# Patient Record
Sex: Male | Born: 1940
Health system: Southern US, Community
[De-identification: ages and names within clinical notes are randomized; demographics above are authoritative.]

---

## 2005-12-05 ENCOUNTER — Ambulatory Visit (HOSPITAL_COMMUNITY): Admission: RE | Admit: 2005-12-05 | Discharge: 2005-12-05 | Payer: Self-pay | Admitting: Orthopedic Surgery

## 2006-07-10 ENCOUNTER — Encounter: Admission: RE | Admit: 2006-07-10 | Discharge: 2006-07-10 | Payer: Self-pay | Admitting: Internal Medicine

## 2011-12-14 DIAGNOSIS — Z1331 Encounter for screening for depression: Secondary | ICD-10-CM | POA: Diagnosis not present

## 2011-12-14 DIAGNOSIS — Z0000 Encounter for general adult medical examination without abnormal findings: Secondary | ICD-10-CM | POA: Diagnosis not present

## 2011-12-14 DIAGNOSIS — E7800 Pure hypercholesterolemia, unspecified: Secondary | ICD-10-CM | POA: Diagnosis not present

## 2011-12-14 DIAGNOSIS — I10 Essential (primary) hypertension: Secondary | ICD-10-CM | POA: Diagnosis not present

## 2012-02-20 DIAGNOSIS — L419 Parapsoriasis, unspecified: Secondary | ICD-10-CM | POA: Diagnosis not present

## 2012-02-20 DIAGNOSIS — Q828 Other specified congenital malformations of skin: Secondary | ICD-10-CM | POA: Diagnosis not present

## 2012-03-25 DIAGNOSIS — H524 Presbyopia: Secondary | ICD-10-CM | POA: Diagnosis not present

## 2012-03-25 DIAGNOSIS — H35369 Drusen (degenerative) of macula, unspecified eye: Secondary | ICD-10-CM | POA: Diagnosis not present

## 2012-05-18 DIAGNOSIS — Z23 Encounter for immunization: Secondary | ICD-10-CM | POA: Diagnosis not present

## 2012-12-19 DIAGNOSIS — I10 Essential (primary) hypertension: Secondary | ICD-10-CM | POA: Diagnosis not present

## 2012-12-19 DIAGNOSIS — Z1331 Encounter for screening for depression: Secondary | ICD-10-CM | POA: Diagnosis not present

## 2012-12-19 DIAGNOSIS — Z0000 Encounter for general adult medical examination without abnormal findings: Secondary | ICD-10-CM | POA: Diagnosis not present

## 2012-12-19 DIAGNOSIS — E7800 Pure hypercholesterolemia, unspecified: Secondary | ICD-10-CM | POA: Diagnosis not present

## 2013-01-24 DIAGNOSIS — D1801 Hemangioma of skin and subcutaneous tissue: Secondary | ICD-10-CM | POA: Diagnosis not present

## 2013-01-24 DIAGNOSIS — L570 Actinic keratosis: Secondary | ICD-10-CM | POA: Diagnosis not present

## 2013-01-24 DIAGNOSIS — L821 Other seborrheic keratosis: Secondary | ICD-10-CM | POA: Diagnosis not present

## 2013-04-09 DIAGNOSIS — Z23 Encounter for immunization: Secondary | ICD-10-CM | POA: Diagnosis not present

## 2014-01-06 DIAGNOSIS — E7800 Pure hypercholesterolemia, unspecified: Secondary | ICD-10-CM | POA: Diagnosis not present

## 2014-01-06 DIAGNOSIS — Z0000 Encounter for general adult medical examination without abnormal findings: Secondary | ICD-10-CM | POA: Diagnosis not present

## 2014-01-06 DIAGNOSIS — Z23 Encounter for immunization: Secondary | ICD-10-CM | POA: Diagnosis not present

## 2014-01-06 DIAGNOSIS — I700 Atherosclerosis of aorta: Secondary | ICD-10-CM | POA: Diagnosis not present

## 2014-01-06 DIAGNOSIS — I10 Essential (primary) hypertension: Secondary | ICD-10-CM | POA: Diagnosis not present

## 2014-03-17 DIAGNOSIS — K648 Other hemorrhoids: Secondary | ICD-10-CM | POA: Diagnosis not present

## 2014-03-17 DIAGNOSIS — Z800 Family history of malignant neoplasm of digestive organs: Secondary | ICD-10-CM | POA: Diagnosis not present

## 2014-03-17 DIAGNOSIS — Z1211 Encounter for screening for malignant neoplasm of colon: Secondary | ICD-10-CM | POA: Diagnosis not present

## 2014-05-27 DIAGNOSIS — Z23 Encounter for immunization: Secondary | ICD-10-CM | POA: Diagnosis not present

## 2014-06-15 DIAGNOSIS — Z23 Encounter for immunization: Secondary | ICD-10-CM | POA: Diagnosis not present

## 2014-06-23 DIAGNOSIS — H2513 Age-related nuclear cataract, bilateral: Secondary | ICD-10-CM | POA: Diagnosis not present

## 2014-12-24 DIAGNOSIS — L570 Actinic keratosis: Secondary | ICD-10-CM | POA: Diagnosis not present

## 2014-12-24 DIAGNOSIS — L821 Other seborrheic keratosis: Secondary | ICD-10-CM | POA: Diagnosis not present

## 2014-12-24 DIAGNOSIS — D1721 Benign lipomatous neoplasm of skin and subcutaneous tissue of right arm: Secondary | ICD-10-CM | POA: Diagnosis not present

## 2014-12-24 DIAGNOSIS — D1801 Hemangioma of skin and subcutaneous tissue: Secondary | ICD-10-CM | POA: Diagnosis not present

## 2014-12-24 DIAGNOSIS — L820 Inflamed seborrheic keratosis: Secondary | ICD-10-CM | POA: Diagnosis not present

## 2015-01-26 DIAGNOSIS — Z125 Encounter for screening for malignant neoplasm of prostate: Secondary | ICD-10-CM | POA: Diagnosis not present

## 2015-01-26 DIAGNOSIS — E780 Pure hypercholesterolemia: Secondary | ICD-10-CM | POA: Diagnosis not present

## 2015-01-26 DIAGNOSIS — Z23 Encounter for immunization: Secondary | ICD-10-CM | POA: Diagnosis not present

## 2015-01-26 DIAGNOSIS — I10 Essential (primary) hypertension: Secondary | ICD-10-CM | POA: Diagnosis not present

## 2015-01-26 DIAGNOSIS — Z1389 Encounter for screening for other disorder: Secondary | ICD-10-CM | POA: Diagnosis not present

## 2015-01-26 DIAGNOSIS — Z0000 Encounter for general adult medical examination without abnormal findings: Secondary | ICD-10-CM | POA: Diagnosis not present

## 2015-05-14 DIAGNOSIS — Z23 Encounter for immunization: Secondary | ICD-10-CM | POA: Diagnosis not present

## 2015-06-29 DIAGNOSIS — L578 Other skin changes due to chronic exposure to nonionizing radiation: Secondary | ICD-10-CM | POA: Diagnosis not present

## 2015-06-29 DIAGNOSIS — D1801 Hemangioma of skin and subcutaneous tissue: Secondary | ICD-10-CM | POA: Diagnosis not present

## 2015-06-29 DIAGNOSIS — L570 Actinic keratosis: Secondary | ICD-10-CM | POA: Diagnosis not present

## 2015-06-29 DIAGNOSIS — D1721 Benign lipomatous neoplasm of skin and subcutaneous tissue of right arm: Secondary | ICD-10-CM | POA: Diagnosis not present

## 2015-06-29 DIAGNOSIS — L821 Other seborrheic keratosis: Secondary | ICD-10-CM | POA: Diagnosis not present

## 2015-08-24 DIAGNOSIS — Z23 Encounter for immunization: Secondary | ICD-10-CM | POA: Diagnosis not present

## 2016-01-27 DIAGNOSIS — L853 Xerosis cutis: Secondary | ICD-10-CM | POA: Diagnosis not present

## 2016-01-27 DIAGNOSIS — Z125 Encounter for screening for malignant neoplasm of prostate: Secondary | ICD-10-CM | POA: Diagnosis not present

## 2016-01-27 DIAGNOSIS — Z0000 Encounter for general adult medical examination without abnormal findings: Secondary | ICD-10-CM | POA: Diagnosis not present

## 2016-01-27 DIAGNOSIS — Z1389 Encounter for screening for other disorder: Secondary | ICD-10-CM | POA: Diagnosis not present

## 2016-01-27 DIAGNOSIS — I700 Atherosclerosis of aorta: Secondary | ICD-10-CM | POA: Diagnosis not present

## 2016-01-27 DIAGNOSIS — Z79899 Other long term (current) drug therapy: Secondary | ICD-10-CM | POA: Diagnosis not present

## 2016-01-27 DIAGNOSIS — I10 Essential (primary) hypertension: Secondary | ICD-10-CM | POA: Diagnosis not present

## 2016-01-27 DIAGNOSIS — E7800 Pure hypercholesterolemia, unspecified: Secondary | ICD-10-CM | POA: Diagnosis not present

## 2016-05-09 DIAGNOSIS — Z23 Encounter for immunization: Secondary | ICD-10-CM | POA: Diagnosis not present

## 2016-08-04 DIAGNOSIS — D2261 Melanocytic nevi of right upper limb, including shoulder: Secondary | ICD-10-CM | POA: Diagnosis not present

## 2016-08-04 DIAGNOSIS — D1721 Benign lipomatous neoplasm of skin and subcutaneous tissue of right arm: Secondary | ICD-10-CM | POA: Diagnosis not present

## 2016-08-04 DIAGNOSIS — D1801 Hemangioma of skin and subcutaneous tissue: Secondary | ICD-10-CM | POA: Diagnosis not present

## 2016-08-04 DIAGNOSIS — D2262 Melanocytic nevi of left upper limb, including shoulder: Secondary | ICD-10-CM | POA: Diagnosis not present

## 2016-08-04 DIAGNOSIS — L111 Transient acantholytic dermatosis [Grover]: Secondary | ICD-10-CM | POA: Diagnosis not present

## 2016-08-04 DIAGNOSIS — I788 Other diseases of capillaries: Secondary | ICD-10-CM | POA: Diagnosis not present

## 2016-08-04 DIAGNOSIS — L821 Other seborrheic keratosis: Secondary | ICD-10-CM | POA: Diagnosis not present

## 2016-08-04 DIAGNOSIS — D485 Neoplasm of uncertain behavior of skin: Secondary | ICD-10-CM | POA: Diagnosis not present

## 2016-08-04 DIAGNOSIS — L820 Inflamed seborrheic keratosis: Secondary | ICD-10-CM | POA: Diagnosis not present

## 2017-01-03 DIAGNOSIS — H52203 Unspecified astigmatism, bilateral: Secondary | ICD-10-CM | POA: Diagnosis not present

## 2017-01-03 DIAGNOSIS — H2513 Age-related nuclear cataract, bilateral: Secondary | ICD-10-CM | POA: Diagnosis not present

## 2017-03-08 DIAGNOSIS — H25812 Combined forms of age-related cataract, left eye: Secondary | ICD-10-CM | POA: Diagnosis not present

## 2017-03-08 DIAGNOSIS — H2512 Age-related nuclear cataract, left eye: Secondary | ICD-10-CM | POA: Diagnosis not present

## 2017-03-19 DIAGNOSIS — Z79899 Other long term (current) drug therapy: Secondary | ICD-10-CM | POA: Diagnosis not present

## 2017-03-19 DIAGNOSIS — E7800 Pure hypercholesterolemia, unspecified: Secondary | ICD-10-CM | POA: Diagnosis not present

## 2017-03-19 DIAGNOSIS — Z0000 Encounter for general adult medical examination without abnormal findings: Secondary | ICD-10-CM | POA: Diagnosis not present

## 2017-03-19 DIAGNOSIS — Z1389 Encounter for screening for other disorder: Secondary | ICD-10-CM | POA: Diagnosis not present

## 2017-03-19 DIAGNOSIS — I700 Atherosclerosis of aorta: Secondary | ICD-10-CM | POA: Diagnosis not present

## 2017-03-19 DIAGNOSIS — N401 Enlarged prostate with lower urinary tract symptoms: Secondary | ICD-10-CM | POA: Diagnosis not present

## 2017-03-19 DIAGNOSIS — I10 Essential (primary) hypertension: Secondary | ICD-10-CM | POA: Diagnosis not present

## 2017-03-29 DIAGNOSIS — H2511 Age-related nuclear cataract, right eye: Secondary | ICD-10-CM | POA: Diagnosis not present

## 2017-03-29 DIAGNOSIS — H25811 Combined forms of age-related cataract, right eye: Secondary | ICD-10-CM | POA: Diagnosis not present

## 2017-08-10 DIAGNOSIS — L821 Other seborrheic keratosis: Secondary | ICD-10-CM | POA: Diagnosis not present

## 2017-08-10 DIAGNOSIS — L570 Actinic keratosis: Secondary | ICD-10-CM | POA: Diagnosis not present

## 2017-08-10 DIAGNOSIS — D1801 Hemangioma of skin and subcutaneous tissue: Secondary | ICD-10-CM | POA: Diagnosis not present

## 2017-10-19 ENCOUNTER — Ambulatory Visit (INDEPENDENT_AMBULATORY_CARE_PROVIDER_SITE_OTHER): Payer: Medicare Other

## 2017-10-19 ENCOUNTER — Ambulatory Visit (INDEPENDENT_AMBULATORY_CARE_PROVIDER_SITE_OTHER): Payer: Medicare Other | Admitting: Orthopedic Surgery

## 2017-10-19 DIAGNOSIS — M25561 Pain in right knee: Secondary | ICD-10-CM

## 2017-10-19 DIAGNOSIS — M25461 Effusion, right knee: Secondary | ICD-10-CM

## 2017-10-20 ENCOUNTER — Encounter (INDEPENDENT_AMBULATORY_CARE_PROVIDER_SITE_OTHER): Payer: Self-pay | Admitting: Orthopedic Surgery

## 2017-10-20 NOTE — Progress Notes (Signed)
   Office Visit Note   Patient: Richard Macias           Date of Birth: Dec 27, 1940           MRN: [id number] Visit Date: 10/19/2017 Requested by: No referring provider defined for this encounter. PCP: Richard Manes, MD  Subjective: Chief Complaint  Patient presents with  . Right Knee - Pain    HPI: Richard Macias is an active 77 year old patient with right knee pain.  Been going on for about 4 weeks.  Denies any history of injury but he has been doing some lunging type exercises in his balance class.  He thinks it may be from playing tennis as well.  Over 10 years ago he had arthroscopy in both knees for medial meniscal tears.  He can ride a stationary bike without problems.  He states that his symptoms are gradually getting better.  Localizes the pain in the right knee to the anterior lateral joint line.  Denies any mechanical symptoms.  No issues with the left knee.  He will occasionally take Relafen for the problem which does help.  Has no back issues.              ROS: All systems reviewed are negative as they relate to the chief complaint within the history of present illness.  Patient denies  fevers or chills.   Assessment & Plan: Visit Diagnoses:  1. Acute pain of right knee   2. Effusion, right knee     Plan: Impression is mild right knee effusion with no mechanical symptoms and pretty good looking radiographs.  Problem is likely related to the way he is exercising particularly in that balance class.  Favor activity modification and observation, an injection may be required for worsening symptoms or effusion .  Follow-up with me as needed.  Follow-Up Instructions: Return if symptoms worsen or fail to improve.   Orders:  Orders Placed This Encounter  Procedures  . XR Knee 1-2 Views Right   No orders of the defined types were placed in this encounter.     Procedures: No procedures performed   Clinical Data: No additional findings.  Objective: Vital Signs: There were no  vitals taken for this visit.  Physical Exam:   Constitutional: Patient appears well-developed HEENT:  Head: Normocephalic Eyes:EOM are normal Neck: Normal range of motion Cardiovascular: Normal rate Pulmonary/chest: Effort normal Neurologic: Patient is alert Skin: Skin is warm Psychiatric: Patient has normal mood and affect    Ortho Exam: Orthopedic exam demonstrates trace effusion in the right knee no effusion in the left knee.  Range of motion is full.  No flexion contracture present.  Pedal pulses palpable.  No groin pain with internal/external rotation of the leg.  No other masses lymphadenopathy or skin changes noted in that knee region.  Minimal patellofemoral crepitus is present bilaterally.  Specialty Comments:  No specialty comments available.  Imaging: No results found.   PMFS History: There are no active problems to display for this patient.  History reviewed. No pertinent past medical history.  History reviewed. No pertinent family history.  History reviewed. No pertinent surgical history. Social History   Occupational History  . Not on file  Tobacco Use  . Smoking status: Not on file  Substance and Sexual Activity  . Alcohol use: Not on file  . Drug use: Not on file  . Sexual activity: Not on file

## 2017-12-03 ENCOUNTER — Telehealth (INDEPENDENT_AMBULATORY_CARE_PROVIDER_SITE_OTHER): Payer: Self-pay | Admitting: Orthopedic Surgery

## 2017-12-03 DIAGNOSIS — M25561 Pain in right knee: Secondary | ICD-10-CM

## 2017-12-03 NOTE — Telephone Encounter (Signed)
Please advise. Ok to order?

## 2017-12-03 NOTE — Telephone Encounter (Signed)
I tried calling. No answer. LMVM advising order submitted.

## 2017-12-03 NOTE — Telephone Encounter (Signed)
yes

## 2017-12-03 NOTE — Telephone Encounter (Signed)
Patient is asking for you to put an order in for an MRI for his right knee. He is saying he still cannot fully extend it. He would like a call once order is put in  [phone]

## 2017-12-07 ENCOUNTER — Ambulatory Visit
Admission: RE | Admit: 2017-12-07 | Discharge: 2017-12-07 | Disposition: A | Discharge status: Home / Self Care | Payer: Medicare Other | Source: Ambulatory Visit | Attending: Orthopedic Surgery | Admitting: Orthopedic Surgery

## 2017-12-07 DIAGNOSIS — M25561 Pain in right knee: Secondary | ICD-10-CM

## 2017-12-07 DIAGNOSIS — M1711 Unilateral primary osteoarthritis, right knee: Secondary | ICD-10-CM | POA: Diagnosis not present

## 2017-12-21 ENCOUNTER — Encounter (INDEPENDENT_AMBULATORY_CARE_PROVIDER_SITE_OTHER): Payer: Self-pay | Admitting: Orthopedic Surgery

## 2017-12-21 ENCOUNTER — Encounter: Payer: Self-pay | Admitting: Orthopedic Surgery

## 2017-12-21 DIAGNOSIS — M94261 Chondromalacia, right knee: Secondary | ICD-10-CM | POA: Diagnosis not present

## 2017-12-21 DIAGNOSIS — M23241 Derangement of anterior horn of lateral meniscus due to old tear or injury, right knee: Secondary | ICD-10-CM | POA: Diagnosis not present

## 2017-12-21 DIAGNOSIS — M659 Synovitis and tenosynovitis, unspecified: Secondary | ICD-10-CM | POA: Diagnosis not present

## 2017-12-21 DIAGNOSIS — G8918 Other acute postprocedural pain: Secondary | ICD-10-CM | POA: Diagnosis not present

## 2017-12-24 ENCOUNTER — Ambulatory Visit (INDEPENDENT_AMBULATORY_CARE_PROVIDER_SITE_OTHER): Payer: Medicare Other | Admitting: Orthopedic Surgery

## 2017-12-24 ENCOUNTER — Encounter (INDEPENDENT_AMBULATORY_CARE_PROVIDER_SITE_OTHER): Payer: Self-pay | Admitting: Orthopedic Surgery

## 2017-12-24 DIAGNOSIS — M25461 Effusion, right knee: Secondary | ICD-10-CM

## 2017-12-28 ENCOUNTER — Ambulatory Visit (INDEPENDENT_AMBULATORY_CARE_PROVIDER_SITE_OTHER): Payer: Medicare Other | Admitting: Orthopedic Surgery

## 2017-12-28 DIAGNOSIS — S838X1D Sprain of other specified parts of right knee, subsequent encounter: Secondary | ICD-10-CM

## 2017-12-29 ENCOUNTER — Encounter (INDEPENDENT_AMBULATORY_CARE_PROVIDER_SITE_OTHER): Payer: Self-pay | Admitting: Orthopedic Surgery

## 2017-12-29 NOTE — Progress Notes (Signed)
   Post-Op Visit Note   Patient: Richard Macias           Date of Birth: [birth year]           MRN: [id number] Visit Date: 12/24/2017 PCP: Lajean Manes, MD   Assessment & Plan:  Chief Complaint:  Chief Complaint  Patient presents with  . Right Knee - Pain   Visit Diagnoses:  1. Effusion, right knee     Plan: Richard Macias is now about 4 days out right knee arthroscopy.  He had some swelling in his knee.  Aspiration is performed today and we got about 60 cc out.  Plan to continue appointment for Friday.  Toradol injected into the knee today  Follow-Up Instructions: No follow-ups on file.   Orders:  No orders of the defined types were placed in this encounter.  No orders of the defined types were placed in this encounter.   Imaging: No results found.  PMFS History: There are no active problems to display for this patient.  History reviewed. No pertinent past medical history.  History reviewed. No pertinent family history.  History reviewed. No pertinent surgical history. Social History   Occupational History  . Not on file  Tobacco Use  . Smoking status: Never Smoker  . Smokeless tobacco: Never Used  Substance and Sexual Activity  . Alcohol use: Not on file  . Drug use: Not on file  . Sexual activity: Not on file

## 2017-12-29 NOTE — Progress Notes (Signed)
   Post-Op Visit Note   Patient: Richard Macias           Date of Birth: 09-28-1940           MRN: [id number] Visit Date: 12/28/2017 PCP: Lajean Manes, MD   Assessment & Plan:  Chief Complaint:  Chief Complaint  Patient presents with  . Right Knee - Routine Post Op    12/21/17 right knee arthroscopy   Visit Diagnoses:  1. Injury of meniscus of right knee, subsequent encounter     Plan: Lanny is a patient who is now 7 days out right knee arthroscopy and partial lateral meniscectomy.  Saw him earlier this week and aspirated a fair amount of blood out of the knee.  He is been doing better.  Not taking any medications.  He is walking well.  On exam there is only mild effusion and excellent range of motion.  Portal sutures removed.  Plan is to start a non-loadbearing exercise program focusing on water walking not sooner than 2 weeks as well as stationary bike.  I will see him back as needed  Follow-Up Instructions: Return if symptoms worsen or fail to improve.   Orders:  No orders of the defined types were placed in this encounter.  No orders of the defined types were placed in this encounter.   Imaging: No results found.  PMFS History: There are no active problems to display for this patient.  History reviewed. No pertinent past medical history.  History reviewed. No pertinent family history.  History reviewed. No pertinent surgical history. Social History   Occupational History  . Not on file  Tobacco Use  . Smoking status: Never Smoker  . Smokeless tobacco: Never Used  Substance and Sexual Activity  . Alcohol use: Not on file  . Drug use: Not on file  . Sexual activity: Not on file

## 2018-03-22 DIAGNOSIS — Z0000 Encounter for general adult medical examination without abnormal findings: Secondary | ICD-10-CM | POA: Diagnosis not present

## 2018-03-22 DIAGNOSIS — I700 Atherosclerosis of aorta: Secondary | ICD-10-CM | POA: Diagnosis not present

## 2018-03-22 DIAGNOSIS — Z1389 Encounter for screening for other disorder: Secondary | ICD-10-CM | POA: Diagnosis not present

## 2018-03-22 DIAGNOSIS — E7800 Pure hypercholesterolemia, unspecified: Secondary | ICD-10-CM | POA: Diagnosis not present

## 2018-03-22 DIAGNOSIS — I4510 Unspecified right bundle-branch block: Secondary | ICD-10-CM | POA: Diagnosis not present

## 2018-03-22 DIAGNOSIS — I10 Essential (primary) hypertension: Secondary | ICD-10-CM | POA: Diagnosis not present

## 2018-03-22 DIAGNOSIS — I739 Peripheral vascular disease, unspecified: Secondary | ICD-10-CM | POA: Diagnosis not present

## 2018-03-22 DIAGNOSIS — Z79899 Other long term (current) drug therapy: Secondary | ICD-10-CM | POA: Diagnosis not present

## 2018-03-25 ENCOUNTER — Other Ambulatory Visit (HOSPITAL_COMMUNITY): Payer: Self-pay | Admitting: Geriatric Medicine

## 2018-03-25 DIAGNOSIS — I4510 Unspecified right bundle-branch block: Secondary | ICD-10-CM

## 2018-03-27 DIAGNOSIS — H02831 Dermatochalasis of right upper eyelid: Secondary | ICD-10-CM | POA: Diagnosis not present

## 2018-03-27 DIAGNOSIS — Z961 Presence of intraocular lens: Secondary | ICD-10-CM | POA: Diagnosis not present

## 2018-03-27 DIAGNOSIS — H52203 Unspecified astigmatism, bilateral: Secondary | ICD-10-CM | POA: Diagnosis not present

## 2018-03-27 DIAGNOSIS — H02834 Dermatochalasis of left upper eyelid: Secondary | ICD-10-CM | POA: Diagnosis not present

## 2018-03-28 ENCOUNTER — Other Ambulatory Visit (HOSPITAL_COMMUNITY): Payer: Medicare Other

## 2018-04-05 ENCOUNTER — Ambulatory Visit (HOSPITAL_COMMUNITY): Payer: Medicare Other | Attending: Cardiology

## 2018-04-05 ENCOUNTER — Other Ambulatory Visit: Payer: Self-pay

## 2018-04-05 DIAGNOSIS — E785 Hyperlipidemia, unspecified: Secondary | ICD-10-CM | POA: Insufficient documentation

## 2018-04-05 DIAGNOSIS — I10 Essential (primary) hypertension: Secondary | ICD-10-CM | POA: Insufficient documentation

## 2018-04-05 DIAGNOSIS — I4510 Unspecified right bundle-branch block: Secondary | ICD-10-CM | POA: Insufficient documentation

## 2018-04-05 DIAGNOSIS — I77810 Thoracic aortic ectasia: Secondary | ICD-10-CM | POA: Diagnosis not present

## 2018-04-05 DIAGNOSIS — Z87891 Personal history of nicotine dependence: Secondary | ICD-10-CM | POA: Insufficient documentation

## 2018-04-05 DIAGNOSIS — Z8249 Family history of ischemic heart disease and other diseases of the circulatory system: Secondary | ICD-10-CM | POA: Insufficient documentation

## 2018-05-17 DIAGNOSIS — H02831 Dermatochalasis of right upper eyelid: Secondary | ICD-10-CM | POA: Diagnosis not present

## 2018-05-17 DIAGNOSIS — H02834 Dermatochalasis of left upper eyelid: Secondary | ICD-10-CM | POA: Diagnosis not present

## 2018-08-12 DIAGNOSIS — L820 Inflamed seborrheic keratosis: Secondary | ICD-10-CM | POA: Diagnosis not present

## 2018-08-12 DIAGNOSIS — L308 Other specified dermatitis: Secondary | ICD-10-CM | POA: Diagnosis not present

## 2018-08-12 DIAGNOSIS — L821 Other seborrheic keratosis: Secondary | ICD-10-CM | POA: Diagnosis not present

## 2018-08-12 DIAGNOSIS — D1801 Hemangioma of skin and subcutaneous tissue: Secondary | ICD-10-CM | POA: Diagnosis not present

## 2018-08-12 DIAGNOSIS — L570 Actinic keratosis: Secondary | ICD-10-CM | POA: Diagnosis not present

## 2018-08-15 DIAGNOSIS — H02834 Dermatochalasis of left upper eyelid: Secondary | ICD-10-CM | POA: Diagnosis not present

## 2018-08-15 DIAGNOSIS — H53453 Other localized visual field defect, bilateral: Secondary | ICD-10-CM | POA: Diagnosis not present

## 2018-08-15 DIAGNOSIS — H02831 Dermatochalasis of right upper eyelid: Secondary | ICD-10-CM | POA: Diagnosis not present

## 2018-09-30 DIAGNOSIS — I700 Atherosclerosis of aorta: Secondary | ICD-10-CM | POA: Diagnosis not present

## 2018-09-30 DIAGNOSIS — Z79899 Other long term (current) drug therapy: Secondary | ICD-10-CM | POA: Diagnosis not present

## 2018-09-30 DIAGNOSIS — I10 Essential (primary) hypertension: Secondary | ICD-10-CM | POA: Diagnosis not present

## 2018-11-18 IMAGING — MR MR KNEE*R* W/O CM
4 of 6 series · 23 of 40 positions shown · non-contrast
Comparison: MRI 12/05/2005

CLINICAL DATA: Mechanical knee symptoms such as locking catching
and crepitus. Symptoms for approximately 8 weeks. History of prior
arthroscopic knee surgery.

EXAM:
MRI OF THE RIGHT KNEE WITHOUT CONTRAST
TECHNIQUE: Multiplanar, multisequence MR imaging of the knee was performed. No
intravenous contrast was administered.

[Series 3: PD fat-sat · axial · 4.0mm · 0.50mm/px · z∈[-76,+72]mm · 8 of 32 slices shown (1 of 3)]
[im 1/32]
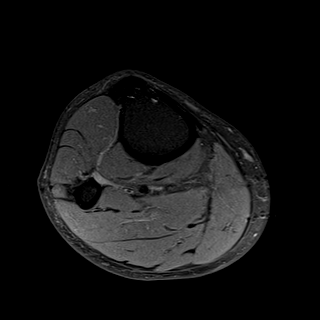
[im 5/32]
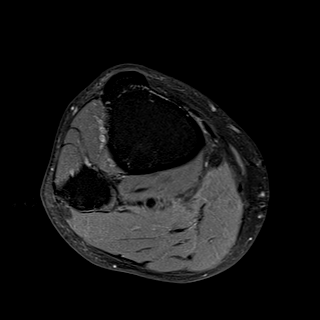
[im 9/32]
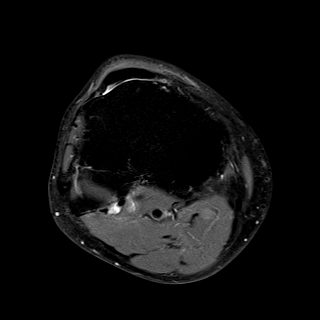
[im 14/32]
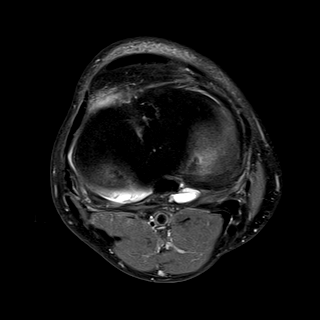
[im 18/32]
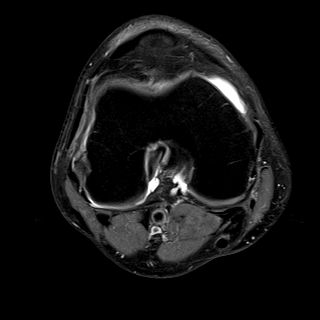
[im 23/32]
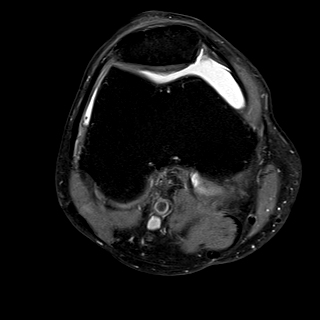
[im 27/32]
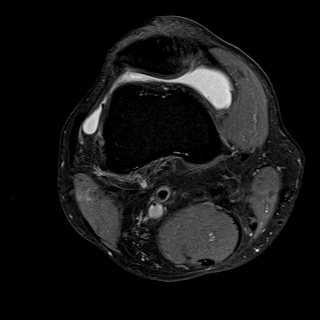
[im 32/32]
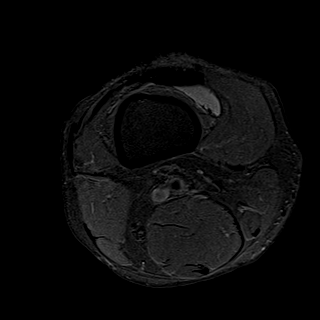

[Series 4: T2 fat-sat · coronal · 3.5mm · 0.31mm/px · 3 of 29 slices shown]
[im 5/29]
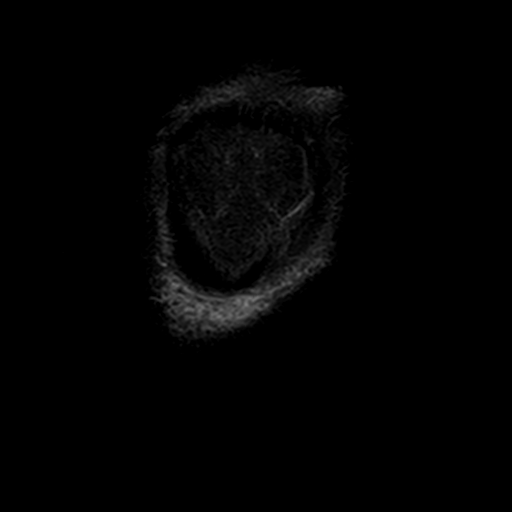
[im 15/29]
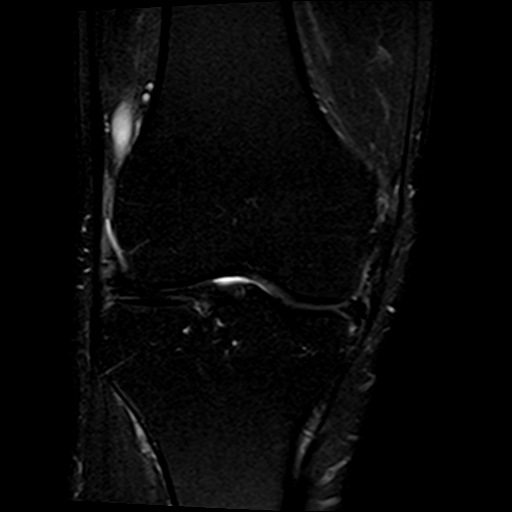
[im 24/29]
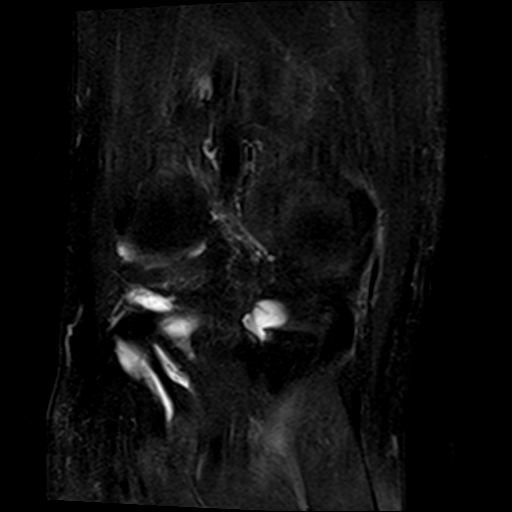

[Series 6: PD fat-sat · sagittal · 3.5mm · 0.31mm/px · 7 of 30 slices shown (2 of 3)]
[im 1/30]
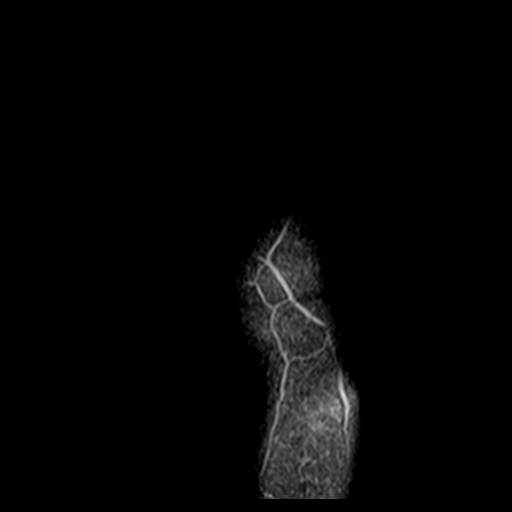
[im 5/30]
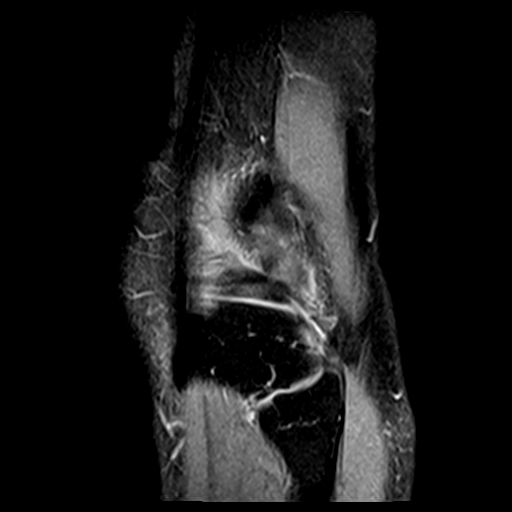
[im 10/30]
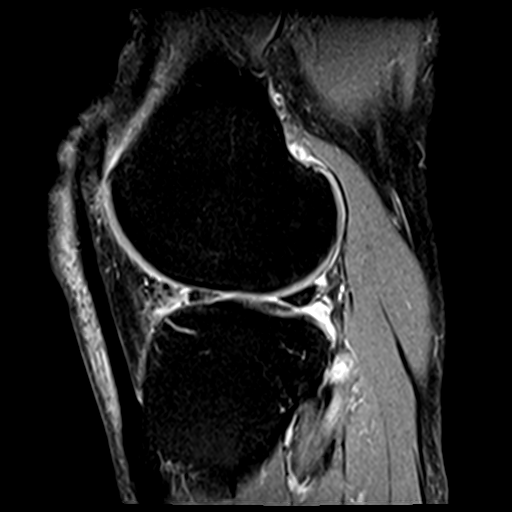
[im 15/30]
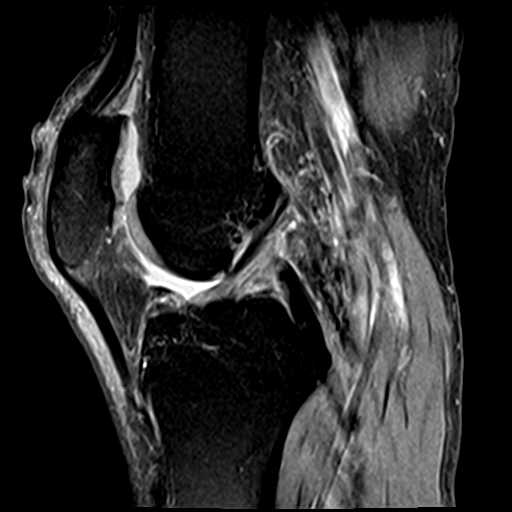
[im 20/30]
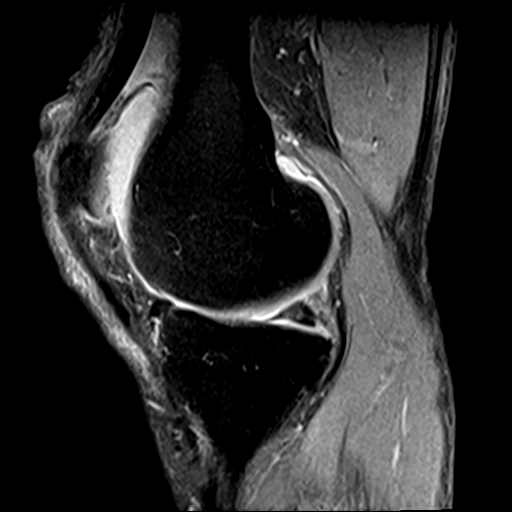
[im 25/30]
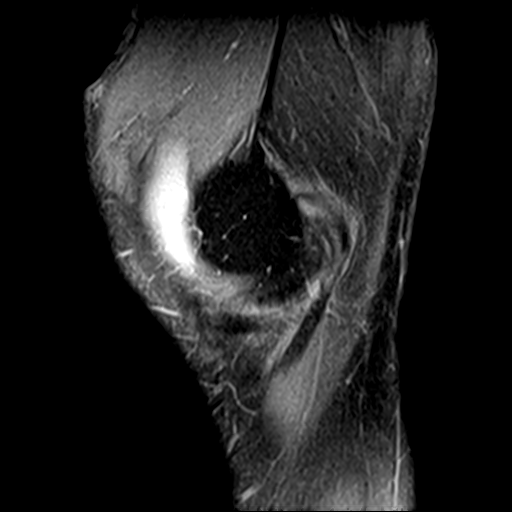
[im 30/30]
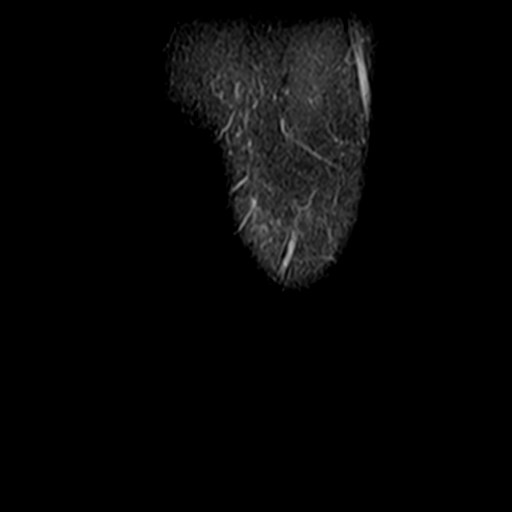

[Series 7: PD fat-sat · coronal · 3.5mm · 0.31mm/px · 5 of 29 slices shown (3 of 3)]
[im 1/29]
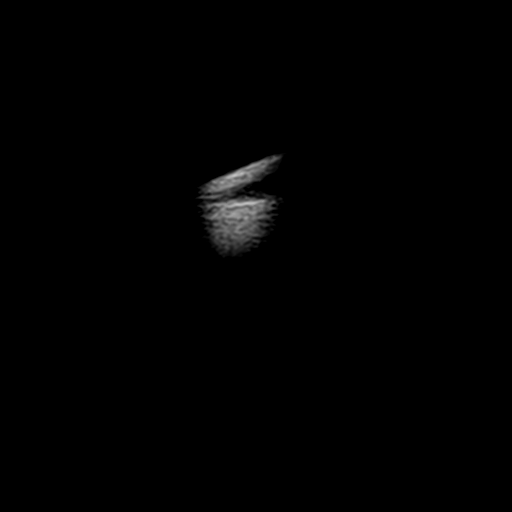
[im 5/29]
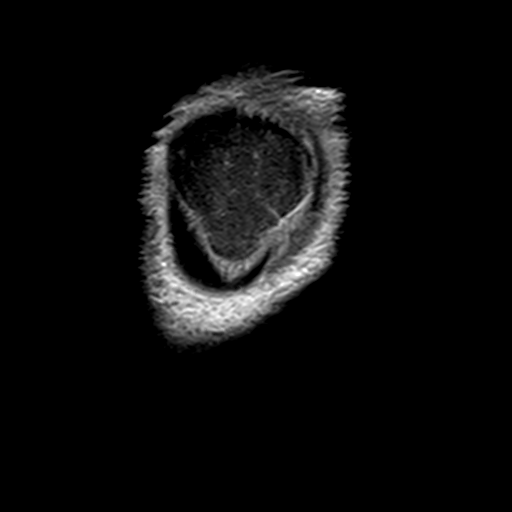
[im 10/29]
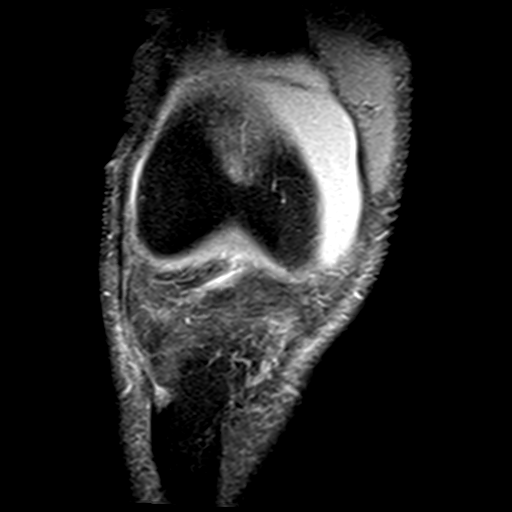
[im 15/29]
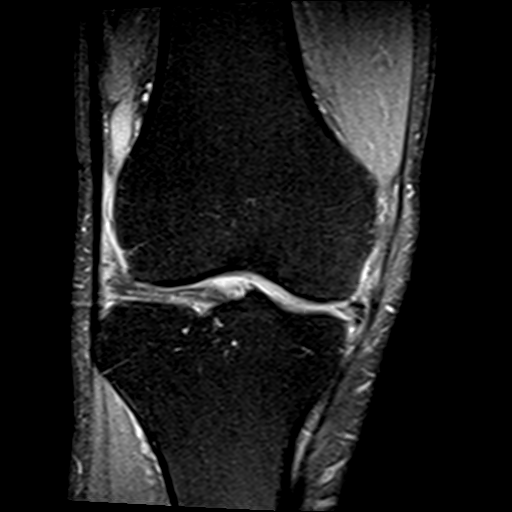
[im 24/29]
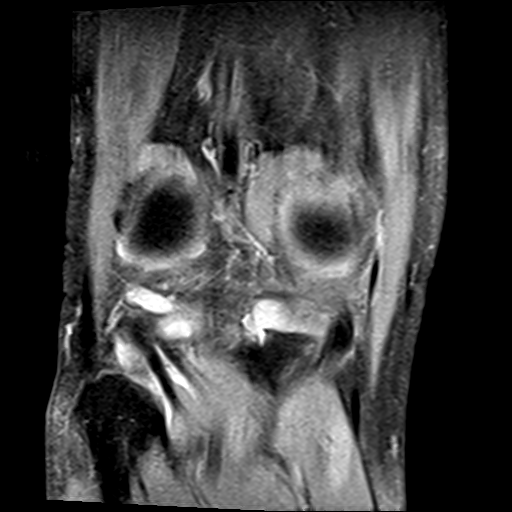

[23 of 40 positions shown; findings below may reference images not displayed]

FINDINGS: MENISCI

Medial meniscus: The surgical changes from a prior partial medial
meniscectomy. There is a small longitudinal inferior articular
surface tear involving the posterior horn back near the meniscal
root. This is likely not clinically significant.

Lateral meniscus: Fairly extensive tearing of the lateral meniscus
with inferior articular surface tears involving the posterior horn
and a more complex tear involving the midbody region with inferior
and superior surface components.

LIGAMENTS

Cruciates:  Intact

Collaterals:  Intact

CARTILAGE

Patellofemoral: Moderate degenerative chondrosis mainly involving
the medial facet.

Medial: Moderate degenerative chondrosis. There is also mild joint
space narrowing and early spurring.

Lateral: Mild to moderate degenerative chondrosis with early
spurring.

Joint: Moderate-sized joint effusion and mild synovitis. Superior
patellar plica noted.

Popliteal Fossa: No popliteal mass or Baker's cyst. Some fluid
tracking back along the popliteus tendon.

Extensor Mechanism: The patella retinacular structures are intact
and the quadriceps and patellar tendons are intact. Mild to moderate
proximal patellar tendinopathy.

Bones: No acute bony findings. No bone contusion, marrow edema or
osteochondral lesion.

Other: Normal knee musculature.
IMPRESSION: 1. Surgical changes from a prior partial medial meniscectomy. Small
longitudinal inferior articular surface tear near the meniscal root.
2. Fairly extensive tearing of the lateral meniscus as detailed
above.
3. Intact ligamentous structures and no acute bony findings.
4. Tricompartmental degenerative changes as described above.
5. Moderate-sized joint effusion and mild synovitis.
6. Mild to moderate proximal patellar tendinopathy.

## 2019-03-28 DIAGNOSIS — I10 Essential (primary) hypertension: Secondary | ICD-10-CM | POA: Diagnosis not present

## 2019-03-28 DIAGNOSIS — Z0000 Encounter for general adult medical examination without abnormal findings: Secondary | ICD-10-CM | POA: Diagnosis not present

## 2019-03-28 DIAGNOSIS — Z79899 Other long term (current) drug therapy: Secondary | ICD-10-CM | POA: Diagnosis not present

## 2019-03-28 DIAGNOSIS — R002 Palpitations: Secondary | ICD-10-CM | POA: Diagnosis not present

## 2019-03-28 DIAGNOSIS — Z1389 Encounter for screening for other disorder: Secondary | ICD-10-CM | POA: Diagnosis not present

## 2019-03-28 DIAGNOSIS — E7800 Pure hypercholesterolemia, unspecified: Secondary | ICD-10-CM | POA: Diagnosis not present

## 2019-03-28 DIAGNOSIS — I700 Atherosclerosis of aorta: Secondary | ICD-10-CM | POA: Diagnosis not present

## 2019-03-28 DIAGNOSIS — I739 Peripheral vascular disease, unspecified: Secondary | ICD-10-CM | POA: Diagnosis not present

## 2019-04-01 DIAGNOSIS — L570 Actinic keratosis: Secondary | ICD-10-CM | POA: Diagnosis not present

## 2019-04-01 DIAGNOSIS — L820 Inflamed seborrheic keratosis: Secondary | ICD-10-CM | POA: Diagnosis not present

## 2019-04-01 DIAGNOSIS — L821 Other seborrheic keratosis: Secondary | ICD-10-CM | POA: Diagnosis not present

## 2019-04-03 DIAGNOSIS — N401 Enlarged prostate with lower urinary tract symptoms: Secondary | ICD-10-CM | POA: Diagnosis not present

## 2019-04-03 DIAGNOSIS — I10 Essential (primary) hypertension: Secondary | ICD-10-CM | POA: Diagnosis not present

## 2019-04-10 DIAGNOSIS — N401 Enlarged prostate with lower urinary tract symptoms: Secondary | ICD-10-CM | POA: Diagnosis not present

## 2019-04-10 DIAGNOSIS — I10 Essential (primary) hypertension: Secondary | ICD-10-CM | POA: Diagnosis not present

## 2019-04-17 DIAGNOSIS — I10 Essential (primary) hypertension: Secondary | ICD-10-CM | POA: Diagnosis not present

## 2019-04-17 DIAGNOSIS — E7800 Pure hypercholesterolemia, unspecified: Secondary | ICD-10-CM | POA: Diagnosis not present

## 2019-04-17 DIAGNOSIS — Z79899 Other long term (current) drug therapy: Secondary | ICD-10-CM | POA: Diagnosis not present

## 2019-04-17 DIAGNOSIS — R002 Palpitations: Secondary | ICD-10-CM | POA: Diagnosis not present

## 2019-04-23 ENCOUNTER — Other Ambulatory Visit: Payer: Self-pay

## 2019-04-23 DIAGNOSIS — Z20822 Contact with and (suspected) exposure to covid-19: Secondary | ICD-10-CM

## 2019-04-24 ENCOUNTER — Telehealth: Payer: Self-pay | Admitting: Geriatric Medicine

## 2019-04-24 LAB — NOVEL CORONAVIRUS, NAA: SARS-CoV-2, NAA: NOT DETECTED

## 2019-04-24 NOTE — Telephone Encounter (Signed)
PAteint received his covid test result

## 2019-05-10 DIAGNOSIS — R002 Palpitations: Secondary | ICD-10-CM | POA: Diagnosis not present

## 2019-05-15 DIAGNOSIS — Z79899 Other long term (current) drug therapy: Secondary | ICD-10-CM | POA: Diagnosis not present

## 2019-05-15 DIAGNOSIS — R7989 Other specified abnormal findings of blood chemistry: Secondary | ICD-10-CM | POA: Diagnosis not present

## 2019-05-29 DIAGNOSIS — N401 Enlarged prostate with lower urinary tract symptoms: Secondary | ICD-10-CM | POA: Diagnosis not present

## 2019-05-29 DIAGNOSIS — E7800 Pure hypercholesterolemia, unspecified: Secondary | ICD-10-CM | POA: Diagnosis not present

## 2019-05-29 DIAGNOSIS — I10 Essential (primary) hypertension: Secondary | ICD-10-CM | POA: Diagnosis not present

## 2019-06-16 DIAGNOSIS — H52203 Unspecified astigmatism, bilateral: Secondary | ICD-10-CM | POA: Diagnosis not present

## 2019-06-16 DIAGNOSIS — H26491 Other secondary cataract, right eye: Secondary | ICD-10-CM | POA: Diagnosis not present

## 2019-06-25 ENCOUNTER — Other Ambulatory Visit: Payer: Self-pay

## 2019-06-25 DIAGNOSIS — Z20822 Contact with and (suspected) exposure to covid-19: Secondary | ICD-10-CM

## 2019-06-25 DIAGNOSIS — Z20828 Contact with and (suspected) exposure to other viral communicable diseases: Secondary | ICD-10-CM | POA: Diagnosis not present

## 2019-06-27 LAB — NOVEL CORONAVIRUS, NAA: SARS-CoV-2, NAA: NOT DETECTED

## 2019-08-18 DIAGNOSIS — L820 Inflamed seborrheic keratosis: Secondary | ICD-10-CM | POA: Diagnosis not present

## 2019-08-21 DIAGNOSIS — Z23 Encounter for immunization: Secondary | ICD-10-CM | POA: Diagnosis not present

## 2019-08-29 DIAGNOSIS — E7800 Pure hypercholesterolemia, unspecified: Secondary | ICD-10-CM | POA: Diagnosis not present

## 2019-08-29 DIAGNOSIS — N401 Enlarged prostate with lower urinary tract symptoms: Secondary | ICD-10-CM | POA: Diagnosis not present

## 2019-08-29 DIAGNOSIS — I10 Essential (primary) hypertension: Secondary | ICD-10-CM | POA: Diagnosis not present

## 2019-09-17 DIAGNOSIS — I10 Essential (primary) hypertension: Secondary | ICD-10-CM | POA: Diagnosis not present

## 2019-09-17 DIAGNOSIS — N401 Enlarged prostate with lower urinary tract symptoms: Secondary | ICD-10-CM | POA: Diagnosis not present

## 2019-09-17 DIAGNOSIS — Z23 Encounter for immunization: Secondary | ICD-10-CM | POA: Diagnosis not present

## 2019-09-17 DIAGNOSIS — E7800 Pure hypercholesterolemia, unspecified: Secondary | ICD-10-CM | POA: Diagnosis not present

## 2019-09-22 DIAGNOSIS — E7800 Pure hypercholesterolemia, unspecified: Secondary | ICD-10-CM | POA: Diagnosis not present

## 2019-09-22 DIAGNOSIS — I10 Essential (primary) hypertension: Secondary | ICD-10-CM | POA: Diagnosis not present

## 2019-09-22 DIAGNOSIS — R946 Abnormal results of thyroid function studies: Secondary | ICD-10-CM | POA: Diagnosis not present

## 2019-09-22 DIAGNOSIS — I700 Atherosclerosis of aorta: Secondary | ICD-10-CM | POA: Diagnosis not present

## 2019-11-03 DIAGNOSIS — I10 Essential (primary) hypertension: Secondary | ICD-10-CM | POA: Diagnosis not present

## 2019-11-03 DIAGNOSIS — E7800 Pure hypercholesterolemia, unspecified: Secondary | ICD-10-CM | POA: Diagnosis not present

## 2019-11-03 DIAGNOSIS — N401 Enlarged prostate with lower urinary tract symptoms: Secondary | ICD-10-CM | POA: Diagnosis not present

## 2019-11-29 DIAGNOSIS — N401 Enlarged prostate with lower urinary tract symptoms: Secondary | ICD-10-CM | POA: Diagnosis not present

## 2019-11-29 DIAGNOSIS — E7800 Pure hypercholesterolemia, unspecified: Secondary | ICD-10-CM | POA: Diagnosis not present

## 2019-11-29 DIAGNOSIS — I10 Essential (primary) hypertension: Secondary | ICD-10-CM | POA: Diagnosis not present

## 2020-03-30 DIAGNOSIS — Z0000 Encounter for general adult medical examination without abnormal findings: Secondary | ICD-10-CM | POA: Diagnosis not present

## 2020-03-30 DIAGNOSIS — Z79899 Other long term (current) drug therapy: Secondary | ICD-10-CM | POA: Diagnosis not present

## 2020-03-30 DIAGNOSIS — R946 Abnormal results of thyroid function studies: Secondary | ICD-10-CM | POA: Diagnosis not present

## 2020-03-30 DIAGNOSIS — J3089 Other allergic rhinitis: Secondary | ICD-10-CM | POA: Diagnosis not present

## 2020-03-30 DIAGNOSIS — I739 Peripheral vascular disease, unspecified: Secondary | ICD-10-CM | POA: Diagnosis not present

## 2020-03-30 DIAGNOSIS — I700 Atherosclerosis of aorta: Secondary | ICD-10-CM | POA: Diagnosis not present

## 2020-03-30 DIAGNOSIS — I10 Essential (primary) hypertension: Secondary | ICD-10-CM | POA: Diagnosis not present

## 2020-03-30 DIAGNOSIS — E7800 Pure hypercholesterolemia, unspecified: Secondary | ICD-10-CM | POA: Diagnosis not present

## 2020-03-30 DIAGNOSIS — Z1389 Encounter for screening for other disorder: Secondary | ICD-10-CM | POA: Diagnosis not present

## 2020-04-10 DIAGNOSIS — Z23 Encounter for immunization: Secondary | ICD-10-CM | POA: Diagnosis not present

## 2020-05-06 DIAGNOSIS — I10 Essential (primary) hypertension: Secondary | ICD-10-CM | POA: Diagnosis not present

## 2020-05-06 DIAGNOSIS — N401 Enlarged prostate with lower urinary tract symptoms: Secondary | ICD-10-CM | POA: Diagnosis not present

## 2020-05-06 DIAGNOSIS — E7800 Pure hypercholesterolemia, unspecified: Secondary | ICD-10-CM | POA: Diagnosis not present

## 2020-07-12 DIAGNOSIS — Z961 Presence of intraocular lens: Secondary | ICD-10-CM | POA: Diagnosis not present

## 2020-07-12 DIAGNOSIS — H52203 Unspecified astigmatism, bilateral: Secondary | ICD-10-CM | POA: Diagnosis not present

## 2020-08-17 DIAGNOSIS — D225 Melanocytic nevi of trunk: Secondary | ICD-10-CM | POA: Diagnosis not present

## 2020-08-17 DIAGNOSIS — D1721 Benign lipomatous neoplasm of skin and subcutaneous tissue of right arm: Secondary | ICD-10-CM | POA: Diagnosis not present

## 2020-08-17 DIAGNOSIS — L821 Other seborrheic keratosis: Secondary | ICD-10-CM | POA: Diagnosis not present

## 2020-08-17 DIAGNOSIS — L853 Xerosis cutis: Secondary | ICD-10-CM | POA: Diagnosis not present

## 2020-08-17 DIAGNOSIS — D1801 Hemangioma of skin and subcutaneous tissue: Secondary | ICD-10-CM | POA: Diagnosis not present

## 2020-09-28 DIAGNOSIS — I10 Essential (primary) hypertension: Secondary | ICD-10-CM | POA: Diagnosis not present

## 2021-02-01 ENCOUNTER — Other Ambulatory Visit (HOSPITAL_BASED_OUTPATIENT_CLINIC_OR_DEPARTMENT_OTHER): Payer: Self-pay

## 2021-02-01 MED ORDER — FLUOROURACIL 5 % EX CREA: TOPICAL_CREAM | CUTANEOUS | 0 refills | Status: AC

## 2021-03-09 ENCOUNTER — Other Ambulatory Visit (HOSPITAL_BASED_OUTPATIENT_CLINIC_OR_DEPARTMENT_OTHER): Payer: Self-pay

## 2021-03-09 DIAGNOSIS — H0015 Chalazion left lower eyelid: Secondary | ICD-10-CM | POA: Diagnosis not present

## 2021-03-09 DIAGNOSIS — H0011 Chalazion right upper eyelid: Secondary | ICD-10-CM | POA: Diagnosis not present

## 2021-03-09 DIAGNOSIS — H26491 Other secondary cataract, right eye: Secondary | ICD-10-CM | POA: Diagnosis not present

## 2021-03-09 MED ORDER — OFLOXACIN 0.3 % OP SOLN
OPHTHALMIC | 0 refills | Status: AC
  Filled 2021-03-09: qty 5, 7d supply, fill #0

## 2021-03-16 DIAGNOSIS — H26491 Other secondary cataract, right eye: Secondary | ICD-10-CM | POA: Diagnosis not present

## 2021-04-08 DIAGNOSIS — I739 Peripheral vascular disease, unspecified: Secondary | ICD-10-CM | POA: Diagnosis not present

## 2021-04-08 DIAGNOSIS — E7800 Pure hypercholesterolemia, unspecified: Secondary | ICD-10-CM | POA: Diagnosis not present

## 2021-04-08 DIAGNOSIS — I700 Atherosclerosis of aorta: Secondary | ICD-10-CM | POA: Diagnosis not present

## 2021-04-08 DIAGNOSIS — I10 Essential (primary) hypertension: Secondary | ICD-10-CM | POA: Diagnosis not present

## 2021-04-08 DIAGNOSIS — Z1389 Encounter for screening for other disorder: Secondary | ICD-10-CM | POA: Diagnosis not present

## 2021-04-08 DIAGNOSIS — Z23 Encounter for immunization: Secondary | ICD-10-CM | POA: Diagnosis not present

## 2021-04-08 DIAGNOSIS — Z79899 Other long term (current) drug therapy: Secondary | ICD-10-CM | POA: Diagnosis not present

## 2021-04-08 DIAGNOSIS — Z0000 Encounter for general adult medical examination without abnormal findings: Secondary | ICD-10-CM | POA: Diagnosis not present

## 2021-04-30 DIAGNOSIS — Z23 Encounter for immunization: Secondary | ICD-10-CM | POA: Diagnosis not present

## 2021-06-03 DIAGNOSIS — H26492 Other secondary cataract, left eye: Secondary | ICD-10-CM | POA: Diagnosis not present

## 2021-06-08 DIAGNOSIS — H26492 Other secondary cataract, left eye: Secondary | ICD-10-CM | POA: Diagnosis not present

## 2021-08-25 DIAGNOSIS — L570 Actinic keratosis: Secondary | ICD-10-CM | POA: Diagnosis not present

## 2021-08-25 DIAGNOSIS — L853 Xerosis cutis: Secondary | ICD-10-CM | POA: Diagnosis not present

## 2021-08-25 DIAGNOSIS — L821 Other seborrheic keratosis: Secondary | ICD-10-CM | POA: Diagnosis not present

## 2021-08-25 DIAGNOSIS — D1801 Hemangioma of skin and subcutaneous tissue: Secondary | ICD-10-CM | POA: Diagnosis not present

## 2021-08-25 DIAGNOSIS — L812 Freckles: Secondary | ICD-10-CM | POA: Diagnosis not present

## 2021-10-14 DIAGNOSIS — I700 Atherosclerosis of aorta: Secondary | ICD-10-CM | POA: Diagnosis not present

## 2021-10-14 DIAGNOSIS — I10 Essential (primary) hypertension: Secondary | ICD-10-CM | POA: Diagnosis not present

## 2021-10-14 DIAGNOSIS — E7800 Pure hypercholesterolemia, unspecified: Secondary | ICD-10-CM | POA: Diagnosis not present

## 2022-01-12 ENCOUNTER — Ambulatory Visit: Payer: Medicare Other | Attending: Internal Medicine

## 2022-01-13 ENCOUNTER — Other Ambulatory Visit (HOSPITAL_BASED_OUTPATIENT_CLINIC_OR_DEPARTMENT_OTHER): Payer: Self-pay

## 2022-01-13 MED ORDER — PFIZER COVID-19 VAC BIVALENT 30 MCG/0.3ML IM SUSP
INTRAMUSCULAR | 0 refills | Status: AC
  Filled 2022-01-13: qty 0.3, 1d supply, fill #0

## 2022-01-13 NOTE — Progress Notes (Signed)
   Covid-19 Vaccination Clinic  Name:  LAYLA GRAMM    MRN: [id number] DOB: 1941-01-31  01/13/2022  Mr. Fester was observed post Covid-19 immunization for 15 minutes without incident. He was provided with Vaccine Information Sheet and instruction to access the V-Safe system.   Mr. Demirjian was instructed to call [phone] with any severe reactions post vaccine: Difficulty breathing  Swelling of face and throat  A fast heartbeat  A bad rash all over body  Dizziness and weakness

## 2022-04-26 DIAGNOSIS — Z23 Encounter for immunization: Secondary | ICD-10-CM | POA: Diagnosis not present

## 2022-05-05 DIAGNOSIS — R946 Abnormal results of thyroid function studies: Secondary | ICD-10-CM | POA: Diagnosis not present

## 2022-05-05 DIAGNOSIS — I739 Peripheral vascular disease, unspecified: Secondary | ICD-10-CM | POA: Diagnosis not present

## 2022-05-05 DIAGNOSIS — I10 Essential (primary) hypertension: Secondary | ICD-10-CM | POA: Diagnosis not present

## 2022-05-05 DIAGNOSIS — Z79899 Other long term (current) drug therapy: Secondary | ICD-10-CM | POA: Diagnosis not present

## 2022-05-05 DIAGNOSIS — E7800 Pure hypercholesterolemia, unspecified: Secondary | ICD-10-CM | POA: Diagnosis not present

## 2022-05-05 DIAGNOSIS — Z0000 Encounter for general adult medical examination without abnormal findings: Secondary | ICD-10-CM | POA: Diagnosis not present

## 2022-05-05 DIAGNOSIS — Z1331 Encounter for screening for depression: Secondary | ICD-10-CM | POA: Diagnosis not present

## 2022-05-05 DIAGNOSIS — I700 Atherosclerosis of aorta: Secondary | ICD-10-CM | POA: Diagnosis not present

## 2022-05-05 DIAGNOSIS — Z23 Encounter for immunization: Secondary | ICD-10-CM | POA: Diagnosis not present

## 2022-05-17 DIAGNOSIS — H0012 Chalazion right lower eyelid: Secondary | ICD-10-CM | POA: Diagnosis not present

## 2022-08-23 DIAGNOSIS — Z961 Presence of intraocular lens: Secondary | ICD-10-CM | POA: Diagnosis not present

## 2022-08-23 DIAGNOSIS — H52203 Unspecified astigmatism, bilateral: Secondary | ICD-10-CM | POA: Diagnosis not present

## 2022-08-23 DIAGNOSIS — H0012 Chalazion right lower eyelid: Secondary | ICD-10-CM | POA: Diagnosis not present

## 2022-08-29 DIAGNOSIS — L812 Freckles: Secondary | ICD-10-CM | POA: Diagnosis not present

## 2022-08-29 DIAGNOSIS — D1801 Hemangioma of skin and subcutaneous tissue: Secondary | ICD-10-CM | POA: Diagnosis not present

## 2022-08-29 DIAGNOSIS — L821 Other seborrheic keratosis: Secondary | ICD-10-CM | POA: Diagnosis not present

## 2022-08-29 DIAGNOSIS — L820 Inflamed seborrheic keratosis: Secondary | ICD-10-CM | POA: Diagnosis not present

## 2022-10-26 DIAGNOSIS — Z79899 Other long term (current) drug therapy: Secondary | ICD-10-CM | POA: Diagnosis not present

## 2022-10-26 DIAGNOSIS — I10 Essential (primary) hypertension: Secondary | ICD-10-CM | POA: Diagnosis not present

## 2022-10-26 DIAGNOSIS — E7800 Pure hypercholesterolemia, unspecified: Secondary | ICD-10-CM | POA: Diagnosis not present

## 2022-10-26 DIAGNOSIS — Z23 Encounter for immunization: Secondary | ICD-10-CM | POA: Diagnosis not present

## 2022-10-26 DIAGNOSIS — I739 Peripheral vascular disease, unspecified: Secondary | ICD-10-CM | POA: Diagnosis not present

## 2022-10-26 DIAGNOSIS — R946 Abnormal results of thyroid function studies: Secondary | ICD-10-CM | POA: Diagnosis not present

## 2022-10-26 DIAGNOSIS — I700 Atherosclerosis of aorta: Secondary | ICD-10-CM | POA: Diagnosis not present

## 2023-03-28 ENCOUNTER — Other Ambulatory Visit (HOSPITAL_BASED_OUTPATIENT_CLINIC_OR_DEPARTMENT_OTHER): Payer: Self-pay

## 2023-04-16 ENCOUNTER — Other Ambulatory Visit (HOSPITAL_BASED_OUTPATIENT_CLINIC_OR_DEPARTMENT_OTHER): Payer: Self-pay

## 2023-04-16 DIAGNOSIS — Z23 Encounter for immunization: Secondary | ICD-10-CM | POA: Diagnosis not present

## 2023-04-16 MED ORDER — COMIRNATY 30 MCG/0.3ML IM SUSY
0.3000 mL | PREFILLED_SYRINGE | Freq: Once | INTRAMUSCULAR | 0 refills | Status: AC
  Filled 2023-04-16: qty 0.3, 1d supply, fill #0

## 2023-05-14 ENCOUNTER — Other Ambulatory Visit (HOSPITAL_BASED_OUTPATIENT_CLINIC_OR_DEPARTMENT_OTHER): Payer: Self-pay

## 2023-05-14 DIAGNOSIS — Z23 Encounter for immunization: Secondary | ICD-10-CM | POA: Diagnosis not present

## 2023-05-14 MED ORDER — INFLUENZA VAC A&B SURF ANT ADJ 0.5 ML IM SUSY
0.5000 mL | PREFILLED_SYRINGE | Freq: Once | INTRAMUSCULAR | 0 refills | Status: AC
  Filled 2023-05-14: qty 0.5, 1d supply, fill #0

## 2023-05-31 DIAGNOSIS — Z23 Encounter for immunization: Secondary | ICD-10-CM | POA: Diagnosis not present

## 2023-05-31 DIAGNOSIS — Z79899 Other long term (current) drug therapy: Secondary | ICD-10-CM | POA: Diagnosis not present

## 2023-05-31 DIAGNOSIS — I10 Essential (primary) hypertension: Secondary | ICD-10-CM | POA: Diagnosis not present

## 2023-05-31 DIAGNOSIS — R5383 Other fatigue: Secondary | ICD-10-CM | POA: Diagnosis not present

## 2023-05-31 DIAGNOSIS — I700 Atherosclerosis of aorta: Secondary | ICD-10-CM | POA: Diagnosis not present

## 2023-05-31 DIAGNOSIS — E7800 Pure hypercholesterolemia, unspecified: Secondary | ICD-10-CM | POA: Diagnosis not present

## 2023-05-31 DIAGNOSIS — I739 Peripheral vascular disease, unspecified: Secondary | ICD-10-CM | POA: Diagnosis not present

## 2023-05-31 DIAGNOSIS — R946 Abnormal results of thyroid function studies: Secondary | ICD-10-CM | POA: Diagnosis not present

## 2023-05-31 DIAGNOSIS — Z0000 Encounter for general adult medical examination without abnormal findings: Secondary | ICD-10-CM | POA: Diagnosis not present

## 2023-08-24 DIAGNOSIS — Z961 Presence of intraocular lens: Secondary | ICD-10-CM | POA: Diagnosis not present

## 2023-08-24 DIAGNOSIS — H52203 Unspecified astigmatism, bilateral: Secondary | ICD-10-CM | POA: Diagnosis not present

## 2023-10-09 DIAGNOSIS — D1801 Hemangioma of skin and subcutaneous tissue: Secondary | ICD-10-CM | POA: Diagnosis not present

## 2023-10-09 DIAGNOSIS — D2271 Melanocytic nevi of right lower limb, including hip: Secondary | ICD-10-CM | POA: Diagnosis not present

## 2023-10-09 DIAGNOSIS — L821 Other seborrheic keratosis: Secondary | ICD-10-CM | POA: Diagnosis not present

## 2023-10-09 DIAGNOSIS — L570 Actinic keratosis: Secondary | ICD-10-CM | POA: Diagnosis not present

## 2023-10-09 DIAGNOSIS — L812 Freckles: Secondary | ICD-10-CM | POA: Diagnosis not present

## 2023-11-19 ENCOUNTER — Other Ambulatory Visit (HOSPITAL_BASED_OUTPATIENT_CLINIC_OR_DEPARTMENT_OTHER): Payer: Self-pay

## 2023-11-19 DIAGNOSIS — Z23 Encounter for immunization: Secondary | ICD-10-CM | POA: Diagnosis not present

## 2023-11-19 MED ORDER — COMIRNATY 30 MCG/0.3ML IM SUSY
PREFILLED_SYRINGE | INTRAMUSCULAR | 0 refills | Status: AC
  Filled 2023-11-19: qty 0.3, 1d supply, fill #0

## 2023-11-29 DIAGNOSIS — R5383 Other fatigue: Secondary | ICD-10-CM | POA: Diagnosis not present

## 2023-11-29 DIAGNOSIS — R946 Abnormal results of thyroid function studies: Secondary | ICD-10-CM | POA: Diagnosis not present

## 2023-11-29 DIAGNOSIS — I10 Essential (primary) hypertension: Secondary | ICD-10-CM | POA: Diagnosis not present

## 2023-11-29 DIAGNOSIS — I739 Peripheral vascular disease, unspecified: Secondary | ICD-10-CM | POA: Diagnosis not present

## 2023-11-29 DIAGNOSIS — E7800 Pure hypercholesterolemia, unspecified: Secondary | ICD-10-CM | POA: Diagnosis not present

## 2024-02-12 DIAGNOSIS — L570 Actinic keratosis: Secondary | ICD-10-CM | POA: Diagnosis not present

## 2024-02-12 DIAGNOSIS — D485 Neoplasm of uncertain behavior of skin: Secondary | ICD-10-CM | POA: Diagnosis not present

## 2024-02-12 DIAGNOSIS — C44722 Squamous cell carcinoma of skin of right lower limb, including hip: Secondary | ICD-10-CM | POA: Diagnosis not present

## 2024-05-06 DIAGNOSIS — Z23 Encounter for immunization: Secondary | ICD-10-CM | POA: Diagnosis not present

## 2024-06-06 DIAGNOSIS — Z0000 Encounter for general adult medical examination without abnormal findings: Secondary | ICD-10-CM | POA: Diagnosis not present

## 2024-06-06 DIAGNOSIS — E7800 Pure hypercholesterolemia, unspecified: Secondary | ICD-10-CM | POA: Diagnosis not present

## 2024-06-06 DIAGNOSIS — R946 Abnormal results of thyroid function studies: Secondary | ICD-10-CM | POA: Diagnosis not present

## 2024-06-06 DIAGNOSIS — I10 Essential (primary) hypertension: Secondary | ICD-10-CM | POA: Diagnosis not present

## 2024-06-06 DIAGNOSIS — Z79899 Other long term (current) drug therapy: Secondary | ICD-10-CM | POA: Diagnosis not present

## 2024-06-06 DIAGNOSIS — I739 Peripheral vascular disease, unspecified: Secondary | ICD-10-CM | POA: Diagnosis not present

## 2024-06-11 DIAGNOSIS — L905 Scar conditions and fibrosis of skin: Secondary | ICD-10-CM | POA: Diagnosis not present

## 2024-06-11 DIAGNOSIS — Z85828 Personal history of other malignant neoplasm of skin: Secondary | ICD-10-CM | POA: Diagnosis not present
